# Patient Record
Sex: Female | Born: 1980 | Race: White | Hispanic: No | Marital: Married
Health system: Southern US, Community
[De-identification: ages and names within clinical notes are randomized; demographics above are authoritative.]

---

## 2007-07-06 ENCOUNTER — Ambulatory Visit: Payer: Self-pay | Admitting: Gastroenterology

## 2008-12-02 IMAGING — NM NUCLEAR MEDICINE HEPATOHBILIARY INCLUDE GB
2 series · 12 of 12 positions shown · non-contrast
Comparison: none

REASON FOR EXAM: RUQ abdominal pain
COMMENTS:

[Series 1000: gallbladder dynamic (results) · 4.80mm/px · 6 of 60 frames shown]
[frame 6/60]
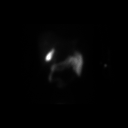
[frame 16/60]
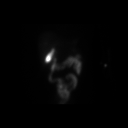
[frame 26/60]
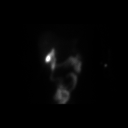
[frame 36/60]
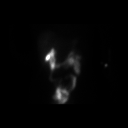
[frame 46/60]
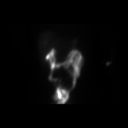
[frame 56/60]
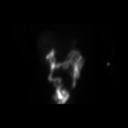

[Series 1000: gallbladder dynamic · 4.80mm/px · 6 of 60 frames shown]
[frame 6/60]
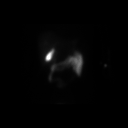
[frame 16/60]
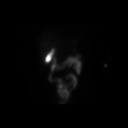
[frame 26/60]
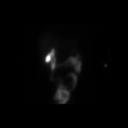
[frame 36/60]
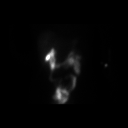
[frame 46/60]
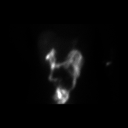
[frame 56/60]
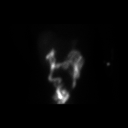

[12 of 12 positions shown; findings below may reference images not displayed]

PROCEDURE:     NM  - NM HEPATO WITH GB EJECT FRACTION  - July 06, 2007 [DATE]

RESULT:     Following intravenous administration of 8.5 mCi of Technetium
99m Choletec, there is noted prompt visualization of tracer activity in the
liver at 3 minutes. At 40 minutes, tracer activity is visualized in the
gallbladder, common duct and proximal small bowel.

The gallbladder ejection fraction at 30 minutes measures 71% which is in the
normal range.
IMPRESSION: 1.  Normal Hepatobiliary Scan.
2.  The gallbladder ejection fraction measures 71% which is in the normal
range.

## 2014-07-03 ENCOUNTER — Telehealth: Payer: Self-pay | Admitting: *Deleted

## 2014-07-03 NOTE — Telephone Encounter (Signed)
error
